# Patient Record
Sex: Male | Born: 2005 | Hispanic: No | Marital: Single | Smoking: Never smoker
Health system: Southern US, Community
[De-identification: ages and names within clinical notes are randomized; demographics above are authoritative.]

## PROBLEM LIST (undated history)

## (undated) DIAGNOSIS — T7840XA Allergy, unspecified, initial encounter: Secondary | ICD-10-CM

## (undated) DIAGNOSIS — M93002 Unspecified slipped upper femoral epiphysis (nontraumatic), left hip: Secondary | ICD-10-CM

## (undated) HISTORY — DX: Allergy, unspecified, initial encounter: T78.40XA

## (undated) HISTORY — DX: Unspecified slipped upper femoral epiphysis (nontraumatic), left hip: M93.002

---
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
## 2024-03-23 ENCOUNTER — Encounter: Payer: Self-pay | Admitting: Family Medicine

## 2024-03-23 ENCOUNTER — Ambulatory Visit: Admitting: Family Medicine

## 2024-03-23 VITALS — BP 98/60 | HR 53 | Temp 98.3°F | Wt 145.8 lb

## 2024-03-23 DIAGNOSIS — K115 Sialolithiasis: Secondary | ICD-10-CM | POA: Diagnosis not present

## 2024-03-23 NOTE — Progress Notes (Signed)
   Subjective:    Patient ID: Allen [name], male    DOB: [birth year], 18 y.o.   MRN: [id number]  HPI Here to check a tender lump under the chin that suddenly appeared 4 days ago. It slowly enlarged for a few days and then stabilized. No ST or oral pain. No trouble swallowing    Review of Systems  Constitutional: Negative.   HENT:  Negative for congestion, facial swelling, mouth sores, sinus pressure, sore throat and trouble swallowing.   Eyes: Negative.   Respiratory: Negative.         Objective:   Physical Exam Constitutional:      Appearance: Normal appearance.  HENT:     Right Ear: Tympanic membrane, ear canal and external ear normal.     Left Ear: Tympanic membrane, ear canal and external ear normal.     Nose: Nose normal.     Mouth/Throat:     Pharynx: Oropharynx is clear.  Eyes:     Conjunctiva/sclera: Conjunctivae normal.  Neck:     Comments: There is a firm immobile 1 cm lump just under the right mandible  Cardiovascular:     Rate and Rhythm: Normal rate and regular rhythm.     Pulses: Normal pulses.     Heart sounds: Normal heart sounds.  Pulmonary:     Effort: Pulmonary effort is normal.     Breath sounds: Normal breath sounds.  Neurological:     Mental Status: He is alert.           Assessment & Plan:  He has a salivary gland stone. He will apply warm compresses and will keep lemon drops in his mouth. He will return if this does not clear in one week . Garnette Olmsted, MD

## 2024-04-09 ENCOUNTER — Ambulatory Visit: Payer: Self-pay | Admitting: *Deleted

## 2024-04-09 NOTE — Telephone Encounter (Signed)
 Copied from CRM [id number]. Topic: Clinical - Red Word Triage >> Apr 09, 2024  8:43 AM Rea ORN wrote: Red Word that prompted transfer to Nurse Triage: Pt has boil on hip that he noticed 3 days ago. It is painful, swollen and warm to touch. Reason for Disposition  Boil > 2 inches across (> 5 cm; larger than a golf ball or ping pong ball)  Answer Assessment - Initial Assessment Questions 1. APPEARANCE of BOIL: What does the boil look like?      Mom [place] calling in.   He is asleep.   Mother not on DPR.    She is trying to wake him now.   He works night shift.   He gave permission.   It's a boil per mother on his right butt cheek.   It's hot and large.   He can't sit down due to the pain.   It started 3 days ago is when he told me.    2. LOCATION: Where is the boil located?      It's on right butt cheek. 3. NUMBER: How many boils are there?      One 4. SIZE: How big is the boil? (e.g., inches, cm; compare to size of a coin or other object)     Golf ball sized 5. ONSET: When did the boil start?     At least 3 days ago 6. PAIN: Is there any pain? If Yes, ask: How bad is the pain?   (Scale 1-10; or mild, moderate, severe)     Yes 7. FEVER: Do you have a fever? If Yes, ask: What is it, how was it measured, and when did it start?      No   He felt a little warm last night The boil is hot feeling.   Not draining  No head on it either.   8. SOURCE: Have you been around anyone with boils or other Staph infections? Have you ever had boils before?     No    Last time his Dad had one it was many years ago. 9. OTHER SYMPTOMS: Do you have any other symptoms? (e.g., shaking chills, weakness, rash elsewhere on body)     No 10. PREGNANCY: Is there any chance you are pregnant? When was your last menstrual period?       N/A  Protocols used: Boil (Skin Abscess)-A-AH FYI Only or Action Required?: FYI only for provider.  Patient was last seen in primary care on 03/23/2024 by Johnny Garnette LABOR, MD.  Called Nurse Triage reporting Mass.Boil golf ball sized on right butt cheek.  Very sore but not draining.    Symptoms began a week ago. At least 3 days ago  Interventions attempted: Nothing.  Symptoms are: gradually worsening.  Triage Disposition: See Physician Within 24 Hours  Patient/caregiver understands and will follow disposition?: Yes

## 2024-04-13 ENCOUNTER — Ambulatory Visit: Admitting: Family Medicine

## 2024-07-20 ENCOUNTER — Ambulatory Visit: Admitting: Family Medicine

## 2024-07-20 ENCOUNTER — Ambulatory Visit: Payer: Self-pay

## 2024-07-20 ENCOUNTER — Encounter: Payer: Self-pay | Admitting: Family Medicine

## 2024-07-20 VITALS — BP 132/88 | HR 52 | Temp 98.0°F | Resp 16 | Wt 154.6 lb

## 2024-07-20 DIAGNOSIS — Z23 Encounter for immunization: Secondary | ICD-10-CM

## 2024-07-20 DIAGNOSIS — S61011A Laceration without foreign body of right thumb without damage to nail, initial encounter: Secondary | ICD-10-CM

## 2024-07-20 NOTE — Progress Notes (Signed)
 Patient is in office today for a nurse visit for Tdap Immunization. Patient Injection was given in the  Left deltoid. Patient tolerated injection well.

## 2024-07-20 NOTE — Progress Notes (Signed)
   Subjective:    Patient ID: Allen [name], male    DOB: [birth year], 18 y.o.   MRN: [id number]  HPI Here for an injury to the right thumb that occurred at home yesterday. While in the bathroom he was dancing, and he struck the edge of a mirror with the thumb. He was able to stop the bleeding with pressure. He then dressed the wound with Vaseline and a Bandaid. Today the area is sore but not too painful.    Review of Systems  Constitutional: Negative.   Respiratory: Negative.    Cardiovascular: Negative.   Skin:  Positive for wound.       Objective:   Physical Exam Constitutional:      Appearance: Normal appearance.  Cardiovascular:     Rate and Rhythm: Normal rate and regular rhythm.     Pulses: Normal pulses.     Heart sounds: Normal heart sounds.  Pulmonary:     Effort: Pulmonary effort is normal.     Breath sounds: Normal breath sounds.  Skin:    Comments: The dorsal distal right thumb has a small laceration with a skin flap, and the flap is now back in place. No signs of infection. The base of the nail is bruised but the nail is intact   Neurological:     Mental Status: He is alert.           Assessment & Plan:  Thumb laceration. This should heal fine without sutures. We applied another Bandaid. He will dress this daily with Neosporin and a Bandaid. Recheck as needed.  Garnette Olmsted, MD

## 2024-07-20 NOTE — Telephone Encounter (Signed)
 FYI Only or Action Required?: FYI only for provider: appointment scheduled on 12/9.  Patient was last seen in primary care on 03/23/2024 by Johnny Garnette LABOR, MD.  Called Nurse Triage reporting Laceration.  Symptoms began yesterday.  Interventions attempted: Rest, hydration, or home remedies.  Symptoms are: unchanged.  Triage Disposition: See HCP Within 4 Hours (Or PCP Triage)  Patient/caregiver understands and will follow disposition?: Yes          Copied from CRM [id number]. Topic: Clinical - Red Word Triage >> Jul 20, 2024 10:39 AM Zy'onna H wrote: [facility] that prompted transfer to Nurse Triage: Patient mother called in and stated he hit his rt. thumb on the medicine cabinet this morning;  his symptoms are the following:  - Bleeding - V - shaped cut on the tip of nail - Extreme/Throbbing pain   **Transf. To NT** Reason for Disposition  [1] MODERATE-SEVERE pain AND [2] blood present under a nail  Answer Assessment - Initial Assessment Questions 1. MECHANISM: How did the injury happen?       Right thumb was hit on medicine cabinet last night   2. ONSET: When did the injury happen? (e.g., minutes, hours ago)      Yesterday   3. LOCATION: What part of the finger is injured? Is the nail damaged?      Cuticle area has the wound and laceration   4. APPEARANCE of the INJURY: What does the injury look like?      Skin flap present   5. SEVERITY: Can you use the hand normally?  Can you bend your fingers into a ball and then fully open them?     Yes  6. SIZE: For cuts, bruises, or swelling, ask: How large is it? (e.g., inches or centimeters;  entire finger)      Centimeters   7. PAIN: Is there pain? If Yes, ask: How bad is the pain?  (Scale 0-10; or none, mild, moderate, severe)  Moderate    9. OTHER SYMPTOMS: Do you have any other symptoms?  No     Patient called in to triage with complaints of a cut on his Right Thumb This happened last  night while moving a medicine cabinet. The patient stated mild bleeding still present, by the cuticle area.   Appointment scheduled for further evaluation; and agrees with the plan of care, and will reach out if symptoms worsen or persist.  Protocols used: Finger Injury-A-AH

## 2024-07-21 NOTE — Telephone Encounter (Signed)
Appointment yesterday
